# Patient Record
Sex: Female | Born: 2005 | Race: White | Hispanic: No | Marital: Single | Smoking: Never smoker
Health system: Southern US, Community
[De-identification: ages and names within clinical notes are randomized; demographics above are authoritative.]

## PROBLEM LIST (undated history)

## (undated) DIAGNOSIS — K8020 Calculus of gallbladder without cholecystitis without obstruction: Secondary | ICD-10-CM

## (undated) HISTORY — DX: Calculus of gallbladder without cholecystitis without obstruction: K80.20

---
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
## 2023-01-06 ENCOUNTER — Encounter: Payer: Self-pay | Admitting: Surgery

## 2023-01-06 ENCOUNTER — Telehealth: Payer: Self-pay | Admitting: Surgery

## 2023-01-06 ENCOUNTER — Ambulatory Visit (INDEPENDENT_AMBULATORY_CARE_PROVIDER_SITE_OTHER): Payer: Federal, State, Local not specified - PPO | Admitting: Surgery

## 2023-01-06 VITALS — BP 123/84 | HR 85 | Temp 98.8°F | Ht 64.0 in | Wt 202.2 lb

## 2023-01-06 DIAGNOSIS — K8020 Calculus of gallbladder without cholecystitis without obstruction: Secondary | ICD-10-CM

## 2023-01-06 NOTE — Telephone Encounter (Signed)
Spoke with mom, Victorino Dike, they have been advised of Pre-Admission date/time, and Surgery date at [facility].  Surgery Date: 01/16/23 Preadmission Testing Date: 01/10/23 (phone 1p-4p)  Patient has been made aware to call [phone], between 1-3:00pm the day before surgery, to find out what time to arrive for surgery.  '

## 2023-01-06 NOTE — Progress Notes (Signed)
01/06/2023  Reason for Visit: Symptomatic cholelithiasis  History of Present Illness: Kathleen Braun is a 17 y.o. female presenting for evaluation of symptomatic cholelithiasis.  The patient presented to emergency room on 12/28/2022 with right upper quadrant pain has been intermittent for several days, associated with nausea.  In the emergency room, her workup showed normal white blood cell count of 9.6 with normal LFTs.  She did have an ultrasound which showed multiple gallstones measuring up to 1.8 cm with borderline wall thickening of 3.2 mm but no pericholecystic fluid.  She was able to be discharged to home.  She reports that initially this pain was very sporadic about 2 years ago when it started but now is almost daily basis.  She is able to keep food down but reports pain with multiple meals.  There is a strong family history and her sister had cholecystectomy last year.  Past Medical History: Past Medical History:  Diagnosis Date   Cholelithiasis      Past Surgical History: History reviewed. No pertinent surgical history.  Home Medications: Prior to Admission medications   Medication Sig Start Date End Date Taking? Authorizing Provider  VIENVA 0.1-20 MG-MCG tablet Take 1 tablet by mouth daily.   Yes [provider]    Allergies: Allergies  Allergen Reactions   Sulfa Antibiotics Hives    Social History:  reports that she has never smoked. She has never used smokeless tobacco. She reports that she does not drink alcohol. No history on file for drug use.   Family History: There is family history of cholelithiasis and her sister had cholecystectomy last year.  Review of Systems: Review of Systems  Constitutional:  Negative for chills and fever.  HENT:  Negative for hearing loss.   Respiratory:  Negative for shortness of breath.   Cardiovascular:  Negative for chest pain.  Gastrointestinal:  Positive for abdominal pain and nausea. Negative for vomiting.   Genitourinary:  Negative for dysuria.  Musculoskeletal:  Negative for myalgias.  Skin:  Negative for rash.  Neurological:  Negative for dizziness.  Psychiatric/Behavioral:  Negative for depression.     Physical Exam BP 123/84   Pulse 85   Temp 98.8 F (37.1 C) (Oral)   Ht 5\' 4"  (1.626 m)   Wt 202 lb 3.2 oz (91.7 kg)   LMP 11/27/2022 (Approximate)   SpO2 98%   BMI 34.71 kg/m  CONSTITUTIONAL: No acute distress HEENT:  Normocephalic, atraumatic, extraocular motion intact. NECK: Trachea is midline, and there is no jugular venous distension.  RESPIRATORY:  Lungs are clear, and breath sounds are equal bilaterally. Normal respiratory effort without pathologic use of accessory muscles. CARDIOVASCULAR: Heart is regular without murmurs, gallops, or rubs. GI: The abdomen is soft, nondistended, with some tenderness to palpation in the right upper quadrant.  Negative Murphy's sign. MUSCULOSKELETAL:  Normal muscle strength and tone in all four extremities.  No peripheral edema or cyanosis. SKIN: Skin turgor is normal. There are no pathologic skin lesions.  NEUROLOGIC:  Motor and sensation is grossly normal.  Cranial nerves are grossly intact. PSYCH:  Alert and oriented to person, place and time. Affect is normal.  Laboratory Analysis: Labs from 12/28/2022: Sodium 134, potassium 3.6, chloride 105, CO2 22, BUN 13, creatinine 0.59.  Total bilirubin 0.5, AST 16, ALT 13, alkaline phosphatase 43, albumin 3.8, lipase 36.  WBC 9.6, hemoglobin 13.4, hematocrit 39.4, platelets 305.  Imaging: Ultrasound RUQ on 12/28/2022: IMPRESSION: Cholelithiasis.  No ultrasound evidence for acute cholecystitis.  Assessment and Plan: This  is a 17 y.o. female with symptomatic cholelithiasis.  - Discussed with patient the findings on her laboratory studies and imaging showing cholelithiasis with no evidence of acute cholecystitis.  Discussed with the patient that sometimes this can run in families and she does have a  family history of this.  Discussed with her how the gallstones in her gallbladder causing her episodes of biliary colic which can result in pain, nausea, and vomiting.  Discussed with her potential options for conservative management.  However given that she is having almost daily episodes of pain, I do not think that a low-fat diet may be enough to help her with this.  She is in agreement and would rather proceed with surgery at this point. - Discussed with her then the plan for robotic assisted cholecystectomy and reviewed the surgery at length with her including the planned incisions, risks of bleeding, infection, injury to surrounding structures, the use of ICG to better evaluate the biliary anatomy, postoperative activity restrictions, pain control, that this would be an outpatient surgery, and she is willing to proceed. - We will schedule her for surgery on 01/16/2023.  All of her questions have been answered.  I spent 60 minutes dedicated to the care of this patient on the date of this encounter to include pre-visit review of records, face-to-face time with the patient discussing diagnosis and management, and any post-visit coordination of care.   Howie Ill, MD [facility]

## 2023-01-06 NOTE — Patient Instructions (Addendum)
Our surgery scheduler Barbara will call you within 24-48 hours to get you scheduled. If you have not heard from her after 48 hours, please call our office. Have the blue sheet available when she calls to write down important information.   If you have any concerns or questions, please feel free to call our office.   Minimally Invasive Cholecystectomy Minimally invasive cholecystectomy is surgery to remove the gallbladder. The gallbladder is a pear-shaped organ that lies beneath the liver on the right side of the body. The gallbladder stores bile, which is a fluid that helps the body digest fats. Cholecystectomy is often done to treat inflammation (irritation and swelling) of the gallbladder (cholecystitis). This condition is usually caused by a buildup of gallstones (cholelithiasis) in the gallbladder or when the fluid in the gall bladder becomes stagnant because gallstones get stuck in the ducts (tubes) and block the flow of bile. This can result in inflammation and pain. In severe cases, emergency surgery may be required. This procedure is done through small incisions in the abdomen, instead of one large incision. It is also called laparoscopic surgery. A thin scope with a camera (laparoscope) is inserted through one incision. Then surgical instruments are inserted through the other incisions. In some cases, a minimally invasive surgery may need to be changed to a surgery that is done through a larger incision. This is called open surgery. Tell a health care provider about: Any allergies you have. All medicines you are taking, including vitamins, herbs, eye drops, creams, and over-the-counter medicines. Any problems you or family members have had with anesthetic medicines. Any bleeding problems you have. Any surgeries you have had. Any medical conditions you have. Whether you are pregnant or may be pregnant. What are the risks? Generally, this is a safe procedure. However, problems may occur,  including: Infection. Bleeding. Allergic reactions to medicines. Damage to nearby structures or organs. A gallstone remaining in the common bile duct. The common bile duct carries bile from the gallbladder to the small intestine. A bile leak from the liver or cystic duct after your gallbladder is removed. What happens before the procedure? When to stop eating and drinking Follow instructions from your health care provider about what you may eat and drink before your procedure. These may include: 8 hours before the procedure Stop eating most foods. Do not eat meat, fried foods, or fatty foods. Eat only light foods, such as toast or crackers. All liquids are okay except energy drinks and alcohol. 6 hours before the procedure Stop eating. Drink only clear liquids, such as water, clear fruit juice, black coffee, plain tea, and sports drinks. Do not drink energy drinks or alcohol. 2 hours before the procedure Stop drinking all liquids. You may be allowed to take medicines with small sips of water. If you do not follow your health care provider's instructions, your procedure may be delayed or canceled. Medicines Ask your health care provider about: Changing or stopping your regular medicines. This is especially important if you are taking diabetes medicines or blood thinners. Taking medicines such as aspirin and ibuprofen. These medicines can thin your blood. Do not take these medicines unless your health care provider tells you to take them. Taking over-the-counter medicines, vitamins, herbs, and supplements. General instructions If you will be going home right after the procedure, plan to have a responsible adult: Take you home from the hospital or clinic. You will not be allowed to drive. Care for you for the time you are told. Do   not use any products that contain nicotine or tobacco for at least 4 weeks before the procedure. These products include cigarettes, chewing tobacco, and vaping  devices, such as e-cigarettes. If you need help quitting, ask your health care provider. Ask your health care provider: How your surgery site will be marked. What steps will be taken to help prevent infection. These may include: Removing hair at the surgery site. Washing skin with a germ-killing soap. Taking antibiotic medicine. What happens during the procedure?  An IV will be inserted into one of your veins. You will be given one or both of the following: A medicine to help you relax (sedative). A medicine to make you fall asleep (general anesthetic). Your surgeon will make several small incisions in your abdomen. The laparoscope will be inserted through one of the small incisions. The camera on the laparoscope will send images to a monitor in the operating room. This lets your surgeon see inside your abdomen. A gas will be pumped into your abdomen. This will expand your abdomen to give the surgeon more room to perform the surgery. Other tools that are needed for the procedure will be inserted through the other incisions. The gallbladder will be removed through one of the incisions. Your common bile duct may be examined. If stones are found in the common bile duct, they may be removed. After your gallbladder has been removed, the incisions will be closed with stitches (sutures), staples, or skin glue. Your incisions will be covered with a bandage (dressing). The procedure may vary among health care providers and hospitals. What happens after the procedure? Your blood pressure, heart rate, breathing rate, and blood oxygen level will be monitored until you leave the hospital or clinic. You will be given medicines as needed to control your pain. You may have a drain placed in the incision. The drain will be removed a day or two after the procedure. Summary Minimally invasive cholecystectomy, also called laparoscopic cholecystectomy, is surgery to remove the gallbladder using small  incisions. Tell your health care provider about all the medical conditions you have and all the medicines you are taking for those conditions. Before the procedure, follow instructions about when to stop eating and drinking and changing or stopping medicines. Plan to have a responsible adult care for you for the time you are told after you leave the hospital or clinic. This information is not intended to replace advice given to you by your health care provider. Make sure you discuss any questions you have with your health care provider.  Gallbladder Eating Plan High blood cholesterol, obesity, a sedentary lifestyle, an unhealthy diet, and diabetes are risk factors for developing gallstones. If you have a gallbladder condition, you may have trouble digesting fats and tolerating high fat intake. Eating a low-fat diet can help reduce your symptoms and may be helpful before and after having surgery to remove your gallbladder (cholecystectomy). Your health care provider may recommend that you work with a dietitian to help you reduce the amount of fat in your diet. What are tips for following this plan? General guidelines Limit your fat intake to less than 30% of your total daily calories. If you eat around 1,800 calories each day, this means eating less than 60 grams (g) of fat per day. Fat is an important part of a healthy diet. Eating a low-fat diet can make it hard to maintain a healthy body weight. Ask your dietitian how much fat, calories, and other nutrients you need each day.   Eat small, frequent meals throughout the day instead of three large meals. Drink at least 8-10 cups (1.9-2.4 L) of fluid a day. Drink enough fluid to keep your urine pale yellow. If you drink alcohol: Limit how much you have to: 0-1 drink a day for women who are not pregnant. 0-2 drinks a day for men. Know how much alcohol is in a drink. In the [place] one drink equals one 12 oz bottle of beer (355 mL), one 5 oz glass of wine  (148 mL), or one 1 oz glass of hard liquor (44 mL). Reading food labels  Check nutrition facts on food labels for the amount of fat per serving. Choose foods with less than 3 grams of fat per serving. Shopping Choose nonfat and low-fat healthy foods. Look for the words "nonfat," "low-fat," or "fat-free." Avoid buying processed or prepackaged foods. Cooking Cook using low-fat methods, such as baking, broiling, grilling, or boiling. Cook with small amounts of healthy fats, such as olive oil, grapeseed oil, canola oil, avocado oil, or sunflower oil. What foods are recommended?  All fresh, frozen, or canned fruits and vegetables. Whole grains. Low-fat or nonfat (skim) milk and yogurt. Lean meat, skinless poultry, fish, eggs, and beans. Low-fat protein supplement powders or drinks. Spices and herbs. The items listed above may not be a complete list of foods and beverages you can eat and drink. Contact a dietitian for more information. What foods are not recommended? High-fat foods. These include baked goods, fast food, fatty cuts of meat, ice cream, french toast, sweet rolls, pizza, cheese bread, foods covered with butter, creamy sauces, or cheese. Fried foods. These include french fries, tempura, battered fish, breaded chicken, fried breads, and sweets. Foods that cause bloating and gas. The items listed above may not be a complete list of foods that you should avoid. Contact a dietitian for more information. Summary A low-fat diet can be helpful if you have a gallbladder condition, or before and after gallbladder surgery. Limit your fat intake to less than 30% of your total daily calories. This is about 60 g of fat if you eat 1,800 calories each day. Eat small, frequent meals throughout the day instead of three large meals. This information is not intended to replace advice given to you by your health care provider. Make sure you discuss any questions you have with your health care  provider. Document Revised: 06/08/2021 Document Reviewed: 06/08/2021 Elsevier Patient Education  2024 [employer]  

## 2023-01-06 NOTE — H&P (View-Only) (Signed)
01/06/2023  Reason for Visit: Symptomatic cholelithiasis  History of Present Illness: Kathleen Braun is a 17 y.o. female presenting for evaluation of symptomatic cholelithiasis.  The patient presented to emergency room on 12/28/2022 with right upper quadrant pain has been intermittent for several days, associated with nausea.  In the emergency room, her workup showed normal white blood cell count of 9.6 with normal LFTs.  She did have an ultrasound which showed multiple gallstones measuring up to 1.8 cm with borderline wall thickening of 3.2 mm but no pericholecystic fluid.  She was able to be discharged to home.  She reports that initially this pain was very sporadic about 2 years ago when it started but now is almost daily basis.  She is able to keep food down but reports pain with multiple meals.  There is a strong family history and her sister had cholecystectomy last year.  Past Medical History: Past Medical History:  Diagnosis Date   Cholelithiasis      Past Surgical History: History reviewed. No pertinent surgical history.  Home Medications: Prior to Admission medications   Medication Sig Start Date End Date Taking? Authorizing Provider  VIENVA 0.1-20 MG-MCG tablet Take 1 tablet by mouth daily.   Yes [provider]    Allergies: Allergies  Allergen Reactions   Sulfa Antibiotics Hives    Social History:  reports that she has never smoked. She has never used smokeless tobacco. She reports that she does not drink alcohol. No history on file for drug use.   Family History: There is family history of cholelithiasis and her sister had cholecystectomy last year.  Review of Systems: Review of Systems  Constitutional:  Negative for chills and fever.  HENT:  Negative for hearing loss.   Respiratory:  Negative for shortness of breath.   Cardiovascular:  Negative for chest pain.  Gastrointestinal:  Positive for abdominal pain and nausea. Negative for vomiting.   Genitourinary:  Negative for dysuria.  Musculoskeletal:  Negative for myalgias.  Skin:  Negative for rash.  Neurological:  Negative for dizziness.  Psychiatric/Behavioral:  Negative for depression.     Physical Exam BP 123/84   Pulse 85   Temp 98.8 F (37.1 C) (Oral)   Ht 5' 4" (1.626 m)   Wt 202 lb 3.2 oz (91.7 kg)   LMP 11/27/2022 (Approximate)   SpO2 98%   BMI 34.71 kg/m  CONSTITUTIONAL: No acute distress HEENT:  Normocephalic, atraumatic, extraocular motion intact. NECK: Trachea is midline, and there is no jugular venous distension.  RESPIRATORY:  Lungs are clear, and breath sounds are equal bilaterally. Normal respiratory effort without pathologic use of accessory muscles. CARDIOVASCULAR: Heart is regular without murmurs, gallops, or rubs. GI: The abdomen is soft, nondistended, with some tenderness to palpation in the right upper quadrant.  Negative Murphy's sign. MUSCULOSKELETAL:  Normal muscle strength and tone in all four extremities.  No peripheral edema or cyanosis. SKIN: Skin turgor is normal. There are no pathologic skin lesions.  NEUROLOGIC:  Motor and sensation is grossly normal.  Cranial nerves are grossly intact. PSYCH:  Alert and oriented to person, place and time. Affect is normal.  Laboratory Analysis: Labs from 12/28/2022: Sodium 134, potassium 3.6, chloride 105, CO2 22, BUN 13, creatinine 0.59.  Total bilirubin 0.5, AST 16, ALT 13, alkaline phosphatase 43, albumin 3.8, lipase 36.  WBC 9.6, hemoglobin 13.4, hematocrit 39.4, platelets 305.  Imaging: Ultrasound RUQ on 12/28/2022: IMPRESSION: Cholelithiasis.  No ultrasound evidence for acute cholecystitis.  Assessment and Plan: This   is a 17 y.o. female with symptomatic cholelithiasis.  - Discussed with patient the findings on her laboratory studies and imaging showing cholelithiasis with no evidence of acute cholecystitis.  Discussed with the patient that sometimes this can run in families and she does have a  family history of this.  Discussed with her how the gallstones in her gallbladder causing her episodes of biliary colic which can result in pain, nausea, and vomiting.  Discussed with her potential options for conservative management.  However given that she is having almost daily episodes of pain, I do not think that a low-fat diet may be enough to help her with this.  She is in agreement and would rather proceed with surgery at this point. - Discussed with her then the plan for robotic assisted cholecystectomy and reviewed the surgery at length with her including the planned incisions, risks of bleeding, infection, injury to surrounding structures, the use of ICG to better evaluate the biliary anatomy, postoperative activity restrictions, pain control, that this would be an outpatient surgery, and she is willing to proceed. - We will schedule her for surgery on 01/16/2023.  All of her questions have been answered.  I spent 60 minutes dedicated to the care of this patient on the date of this encounter to include pre-visit review of records, face-to-face time with the patient discussing diagnosis and management, and any post-visit coordination of care.   Yeray Tomas Luis Marwin Primmer, MD [facility]    

## 2023-01-10 ENCOUNTER — Encounter
Admission: RE | Admit: 2023-01-10 | Discharge: 2023-01-10 | Disposition: A | Discharge status: Home / Self Care | Payer: Federal, State, Local not specified - PPO | Source: Ambulatory Visit | Attending: Surgery | Admitting: Surgery

## 2023-01-10 VITALS — Ht 64.0 in | Wt 202.0 lb

## 2023-01-10 DIAGNOSIS — Z01818 Encounter for other preprocedural examination: Secondary | ICD-10-CM

## 2023-01-10 NOTE — Patient Instructions (Signed)
Your procedure is scheduled on: Thursday July 11 Report to the Registration Desk on the 1st floor of the [facility] To find out your arrival time, please call [phone] between 1PM - 3PM on: Wednesday 10  If your arrival time is 6:00 am, do not arrive before that time as the [facility] entrance doors do not open until 6:00 am.  REMEMBER: Instructions that are not followed completely may result in serious medical risk, up to and including death; or upon the discretion of your surgeon and anesthesiologist your surgery may need to be rescheduled.  Do not eat food after midnight the night before surgery.  No gum chewing or hard candies.  You may however, drink CLEAR liquids up to 2 hours before you are scheduled to arrive for your surgery. Do not drink anything within 2 hours of your scheduled arrival time.  Clear liquids include: - water  - apple juice without pulp - gatorade (not RED colors) - black coffee or tea (Do NOT add milk or creamers to the coffee or tea) Do NOT drink anything that is not on this list.  One week prior to surgery: Stop Anti-inflammatories (NSAIDS) such as Advil, Aleve, Ibuprofen, Motrin, Naproxen, Naprosyn and Aspirin based products such as Excedrin, Goody's Powder, BC Powder.  Stop ANY OVER THE COUNTER supplements until after surgery.  You may however, continue to take Tylenol if needed for pain up until the day of surgery.   TAKE ONLY THESE MEDICATIONS THE MORNING OF SURGERY WITH A SIP OF WATER:    No Alcohol for 24 hours before or after surgery.  No Smoking including e-cigarettes for 24 hours before surgery.  No chewable tobacco products for at least 6 hours before surgery.  No nicotine patches on the day of surgery.  Do not use any "recreational" drugs for at least a week (preferably 2 weeks) before your surgery.  Please be advised that the combination of cocaine and anesthesia may have negative outcomes, up to and including death. If you  test positive for cocaine, your surgery will be cancelled.  On the morning of surgery brush your teeth with toothpaste and water, you may rinse your mouth with mouthwash if you wish. Do not swallow any toothpaste or mouthwash.  Use CHG Soap as directed on instruction sheet.  Do not wear jewelry, make-up, hairpins, clips or nail polish.  Do not wear lotions, powders, or perfumes.   Do not shave body hair from the neck down 48 hours before surgery.  Contact lenses, hearing aids and dentures may not be worn into surgery.  Do not bring valuables to the hospital. [facility] is not responsible for any missing/lost belongings or valuables.   Notify your doctor if there is any change in your medical condition (cold, fever, infection).  Wear comfortable clothing (specific to your surgery type) to the hospital.  After surgery, you can help prevent lung complications by doing breathing exercises.  Take deep breaths and cough every 1-2 hours. Your doctor may order a device called an Incentive Spirometer to help you take deep breaths. When coughing or sneezing, hold a pillow firmly against your incision with both hands. This is called "splinting." Doing this helps protect your incision. It also decreases belly discomfort.  If you are being discharged the day of surgery, you will not be allowed to drive home. You will need a responsible individual to drive you home and stay with you for 24 hours after surgery.   If you are taking  public transportation, you will need to have a responsible individual with you.  Please call the Pre-admissions Testing Dept. at [phone] if you have any questions about these instructions.  Surgery Visitation Policy:  Patients having surgery or a procedure may have two visitors.  Children under the age of 66 must have an adult with them who is not the patient.      Preparing for Surgery with CHLORHEXIDINE GLUCONATE (CHG) Soap  Chlorhexidine Gluconate  (CHG) Soap  o An antiseptic cleaner that kills germs and bonds with the skin to continue killing germs even after washing  o Used for showering the night before surgery and morning of surgery  Before surgery, you can play an important role by reducing the number of germs on your skin.  CHG (Chlorhexidine gluconate) soap is an antiseptic cleanser which kills germs and bonds with the skin to continue killing germs even after washing.  Please do not use if you have an allergy to CHG or antibacterial soaps. If your skin becomes reddened/irritated stop using the CHG.  1. Shower the NIGHT BEFORE SURGERY and the MORNING OF SURGERY with CHG soap.  2. If you choose to wash your hair, wash your hair first as usual with your normal shampoo.  3. After shampooing, rinse your hair and body thoroughly to remove the shampoo.  4. Use CHG as you would any other liquid soap. You can apply CHG directly to the skin and wash gently with a scrungie or a clean washcloth.  5. Apply the CHG soap to your body only from the neck down. Do not use on open wounds or open sores. Avoid contact with your eyes, ears, mouth, and genitals (private parts). Wash face and genitals (private parts) with your normal soap.  6. Wash thoroughly, paying special attention to the area where your surgery will be performed.  7. Thoroughly rinse your body with warm water.  8. Do not shower/wash with your normal soap after using and rinsing off the CHG soap.  9. Pat yourself dry with a clean towel.  10. Wear clean pajamas to bed the night before surgery.  12. Place clean sheets on your bed the night of your first shower and do not sleep with pets.  13. Shower again with the CHG soap on the day of surgery prior to arriving at the hospital.  14. Do not apply any deodorants/lotions/powders.  15. Please wear clean clothes to the hospital.

## 2023-01-16 ENCOUNTER — Encounter: Payer: Self-pay | Admitting: Surgery

## 2023-01-16 ENCOUNTER — Ambulatory Visit
Admission: RE | Admit: 2023-01-16 | Discharge: 2023-01-16 | Disposition: A | Discharge status: Home / Self Care | Payer: Federal, State, Local not specified - PPO | Attending: Surgery | Admitting: Surgery

## 2023-01-16 ENCOUNTER — Other Ambulatory Visit: Payer: Self-pay

## 2023-01-16 ENCOUNTER — Ambulatory Visit: Payer: Federal, State, Local not specified - PPO | Admitting: Anesthesiology

## 2023-01-16 ENCOUNTER — Encounter
Admission: RE | Disposition: A | Discharge status: Home / Self Care | Payer: Self-pay | Source: Home / Self Care | Attending: Surgery

## 2023-01-16 DIAGNOSIS — Z01818 Encounter for other preprocedural examination: Secondary | ICD-10-CM

## 2023-01-16 DIAGNOSIS — K8010 Calculus of gallbladder with chronic cholecystitis without obstruction: Secondary | ICD-10-CM | POA: Diagnosis present

## 2023-01-16 DIAGNOSIS — K8020 Calculus of gallbladder without cholecystitis without obstruction: Secondary | ICD-10-CM

## 2023-01-16 LAB — POCT PREGNANCY, URINE: Preg Test, Ur: NEGATIVE

## 2023-01-16 SURGERY — CHOLECYSTECTOMY, ROBOT-ASSISTED, LAPAROSCOPIC
Anesthesia: General

## 2023-01-16 MED ORDER — KETOROLAC TROMETHAMINE 30 MG/ML IJ SOLN
INTRAMUSCULAR | Status: AC
  Filled 2023-01-16: qty 1

## 2023-01-16 MED ORDER — PHENYLEPHRINE 80 MCG/ML (10ML) SYRINGE FOR IV PUSH (FOR BLOOD PRESSURE SUPPORT)
PREFILLED_SYRINGE | INTRAVENOUS | Status: AC
  Filled 2023-01-16: qty 10

## 2023-01-16 MED ORDER — PROPOFOL 10 MG/ML IV BOLUS
INTRAVENOUS | Status: AC
  Filled 2023-01-16: qty 20

## 2023-01-16 MED ORDER — LACTATED RINGERS IV SOLN: INTRAVENOUS | Status: DC

## 2023-01-16 MED ORDER — BUPIVACAINE LIPOSOME 1.3 % IJ SUSP: 20.0000 mL | Freq: Once | INTRAMUSCULAR | Status: DC

## 2023-01-16 MED ORDER — MIDAZOLAM HCL 2 MG/2ML IJ SOLN
INTRAMUSCULAR | Status: AC
  Filled 2023-01-16: qty 2

## 2023-01-16 MED ORDER — DEXAMETHASONE SODIUM PHOSPHATE 10 MG/ML IJ SOLN
INTRAMUSCULAR | Status: AC
  Filled 2023-01-16: qty 1

## 2023-01-16 MED ORDER — KETOROLAC TROMETHAMINE 30 MG/ML IJ SOLN
INTRAMUSCULAR | Status: DC | PRN
  Administered 2023-01-16: 30 mg via INTRAVENOUS

## 2023-01-16 MED ORDER — SUGAMMADEX SODIUM 200 MG/2ML IV SOLN
INTRAVENOUS | Status: DC | PRN
  Administered 2023-01-16: 200 mg via INTRAVENOUS

## 2023-01-16 MED ORDER — ONDANSETRON HCL 4 MG PO TABS: 4.0000 mg | ORAL_TABLET | Freq: Three times a day (TID) | ORAL | 0 refills | Status: DC | PRN

## 2023-01-16 MED ORDER — LIDOCAINE HCL (PF) 2 % IJ SOLN
INTRAMUSCULAR | Status: AC
  Filled 2023-01-16: qty 5

## 2023-01-16 MED ORDER — CEFAZOLIN SODIUM-DEXTROSE 2-4 GM/100ML-% IV SOLN
INTRAVENOUS | Status: AC
  Filled 2023-01-16: qty 100

## 2023-01-16 MED ORDER — FENTANYL CITRATE (PF) 100 MCG/2ML IJ SOLN: 25.0000 ug | INTRAMUSCULAR | Status: DC | PRN

## 2023-01-16 MED ORDER — DEXAMETHASONE SODIUM PHOSPHATE 10 MG/ML IJ SOLN
INTRAMUSCULAR | Status: DC | PRN
  Administered 2023-01-16: 10 mg via INTRAVENOUS

## 2023-01-16 MED ORDER — BUPIVACAINE LIPOSOME 1.3 % IJ SUSP
INTRAMUSCULAR | Status: DC | PRN
  Administered 2023-01-16: 20 mL

## 2023-01-16 MED ORDER — PROMETHAZINE HCL 25 MG/ML IJ SOLN
INTRAMUSCULAR | Status: AC
  Filled 2023-01-16: qty 1

## 2023-01-16 MED ORDER — ACETAMINOPHEN 10 MG/ML IV SOLN: 1000.0000 mg | Freq: Once | INTRAVENOUS | Status: DC | PRN

## 2023-01-16 MED ORDER — EPHEDRINE 5 MG/ML INJ
INTRAVENOUS | Status: AC
  Filled 2023-01-16: qty 5

## 2023-01-16 MED ORDER — CHLORHEXIDINE GLUCONATE 0.12 % MT SOLN
15.0000 mL | Freq: Once | OROMUCOSAL | Status: AC
  Administered 2023-01-16: 15 mL via OROMUCOSAL

## 2023-01-16 MED ORDER — 0.9 % SODIUM CHLORIDE (POUR BTL) OPTIME
TOPICAL | Status: DC | PRN
  Administered 2023-01-16: 500 mL

## 2023-01-16 MED ORDER — GABAPENTIN 300 MG PO CAPS
300.0000 mg | ORAL_CAPSULE | ORAL | Status: AC
  Administered 2023-01-16: 300 mg via ORAL

## 2023-01-16 MED ORDER — MIDAZOLAM HCL 2 MG/2ML IJ SOLN
INTRAMUSCULAR | Status: DC | PRN
  Administered 2023-01-16: 2 mg via INTRAVENOUS

## 2023-01-16 MED ORDER — OXYCODONE HCL 5 MG PO TABS: 5.0000 mg | ORAL_TABLET | Freq: Once | ORAL | Status: DC | PRN

## 2023-01-16 MED ORDER — DROPERIDOL 2.5 MG/ML IJ SOLN
0.6250 mg | Freq: Once | INTRAMUSCULAR | Status: AC | PRN
  Administered 2023-01-16: 0.625 mg via INTRAVENOUS

## 2023-01-16 MED ORDER — FAMOTIDINE 20 MG PO TABS
20.0000 mg | ORAL_TABLET | Freq: Once | ORAL | Status: AC
  Administered 2023-01-16: 20 mg via ORAL

## 2023-01-16 MED ORDER — OXYCODONE HCL 5 MG/5ML PO SOLN: 5.0000 mg | Freq: Once | ORAL | Status: DC | PRN

## 2023-01-16 MED ORDER — PROMETHAZINE HCL 25 MG/ML IJ SOLN
6.2500 mg | INTRAMUSCULAR | Status: DC | PRN
  Administered 2023-01-16: 6.25 mg via INTRAVENOUS

## 2023-01-16 MED ORDER — ROCURONIUM BROMIDE 10 MG/ML (PF) SYRINGE
PREFILLED_SYRINGE | INTRAVENOUS | Status: AC
  Filled 2023-01-16: qty 10

## 2023-01-16 MED ORDER — CHLORHEXIDINE GLUCONATE 0.12 % MT SOLN
OROMUCOSAL | Status: AC
  Filled 2023-01-16: qty 15

## 2023-01-16 MED ORDER — FENTANYL CITRATE (PF) 100 MCG/2ML IJ SOLN
INTRAMUSCULAR | Status: DC | PRN
  Administered 2023-01-16 (×2): 50 ug via INTRAVENOUS

## 2023-01-16 MED ORDER — PROPOFOL 10 MG/ML IV BOLUS
INTRAVENOUS | Status: DC | PRN
  Administered 2023-01-16: 200 mg via INTRAVENOUS

## 2023-01-16 MED ORDER — CHLORHEXIDINE GLUCONATE CLOTH 2 % EX PADS
6.0000 | MEDICATED_PAD | Freq: Once | CUTANEOUS | Status: AC
  Administered 2023-01-16: 6 via TOPICAL

## 2023-01-16 MED ORDER — GABAPENTIN 300 MG PO CAPS
ORAL_CAPSULE | ORAL | Status: AC
  Filled 2023-01-16: qty 1

## 2023-01-16 MED ORDER — ACETAMINOPHEN 10 MG/ML IV SOLN
INTRAVENOUS | Status: AC
  Filled 2023-01-16: qty 100

## 2023-01-16 MED ORDER — BUPIVACAINE LIPOSOME 1.3 % IJ SUSP
INTRAMUSCULAR | Status: AC
  Filled 2023-01-16: qty 20

## 2023-01-16 MED ORDER — ACETAMINOPHEN 500 MG PO TABS
ORAL_TABLET | ORAL | Status: AC
  Filled 2023-01-16: qty 2

## 2023-01-16 MED ORDER — BUPIVACAINE-EPINEPHRINE (PF) 0.25% -1:200000 IJ SOLN
INTRAMUSCULAR | Status: DC | PRN
  Administered 2023-01-16: 30 mL

## 2023-01-16 MED ORDER — PHENYLEPHRINE HCL (PRESSORS) 10 MG/ML IV SOLN
INTRAVENOUS | Status: DC | PRN
  Administered 2023-01-16 (×4): 80 ug via INTRAVENOUS

## 2023-01-16 MED ORDER — INDOCYANINE GREEN 25 MG IV SOLR
2.5000 mg | INTRAVENOUS | Status: AC
  Administered 2023-01-16: 2.5 mg via INTRAVENOUS

## 2023-01-16 MED ORDER — ORAL CARE MOUTH RINSE: 15.0000 mL | Freq: Once | OROMUCOSAL | Status: AC

## 2023-01-16 MED ORDER — LIDOCAINE HCL 4 % EX SOLN
CUTANEOUS | Status: DC | PRN
  Administered 2023-01-16: 4 mL via TOPICAL

## 2023-01-16 MED ORDER — FAMOTIDINE 20 MG PO TABS
ORAL_TABLET | ORAL | Status: AC
  Filled 2023-01-16: qty 1

## 2023-01-16 MED ORDER — INDOCYANINE GREEN 25 MG IV SOLR
INTRAVENOUS | Status: AC
  Filled 2023-01-16: qty 10

## 2023-01-16 MED ORDER — DROPERIDOL 2.5 MG/ML IJ SOLN
INTRAMUSCULAR | Status: AC
  Filled 2023-01-16: qty 2

## 2023-01-16 MED ORDER — FENTANYL CITRATE (PF) 100 MCG/2ML IJ SOLN
INTRAMUSCULAR | Status: AC
  Filled 2023-01-16: qty 2

## 2023-01-16 MED ORDER — ACETAMINOPHEN 500 MG PO TABS: 1000.0000 mg | ORAL_TABLET | ORAL | Status: DC

## 2023-01-16 MED ORDER — HYDROCODONE-ACETAMINOPHEN 5-325 MG PO TABS: 1.0000 | ORAL_TABLET | ORAL | 0 refills | Status: DC | PRN

## 2023-01-16 MED ORDER — ONDANSETRON HCL 4 MG/2ML IJ SOLN
INTRAMUSCULAR | Status: DC | PRN
  Administered 2023-01-16: 4 mg via INTRAVENOUS

## 2023-01-16 MED ORDER — ONDANSETRON HCL 4 MG/2ML IJ SOLN
INTRAMUSCULAR | Status: AC
  Filled 2023-01-16: qty 2

## 2023-01-16 MED ORDER — ROCURONIUM BROMIDE 100 MG/10ML IV SOLN
INTRAVENOUS | Status: DC | PRN
  Administered 2023-01-16: 50 mg via INTRAVENOUS

## 2023-01-16 MED ORDER — CEFAZOLIN SODIUM-DEXTROSE 2-4 GM/100ML-% IV SOLN
2.0000 g | INTRAVENOUS | Status: AC
  Administered 2023-01-16: 2 g via INTRAVENOUS

## 2023-01-16 MED ORDER — EPHEDRINE SULFATE (PRESSORS) 50 MG/ML IJ SOLN
INTRAMUSCULAR | Status: DC | PRN
  Administered 2023-01-16: 10 mg via INTRAVENOUS

## 2023-01-16 MED ORDER — ACETAMINOPHEN 10 MG/ML IV SOLN
INTRAVENOUS | Status: DC | PRN
  Administered 2023-01-16: 1000 mg via INTRAVENOUS

## 2023-01-16 MED ORDER — LIDOCAINE HCL (CARDIAC) PF 100 MG/5ML IV SOSY
PREFILLED_SYRINGE | INTRAVENOUS | Status: DC | PRN
  Administered 2023-01-16: 100 mg via INTRAVENOUS

## 2023-01-16 SURGICAL SUPPLY — 49 items
ADH SKN CLS APL DERMABOND .7 (GAUZE/BANDAGES/DRESSINGS) ×1
BAG PRESSURE INF REUSE 1000 (BAG) IMPLANT
CANNULA CAP OBTURATR AIRSEAL 8 (CAP) IMPLANT
CAUTERY HOOK MNPLR 1.6 DVNC XI (INSTRUMENTS) ×1 IMPLANT
CLIP LIGATING HEMO O LOK GREEN (MISCELLANEOUS) ×1 IMPLANT
DERMABOND ADVANCED .7 DNX12 (GAUZE/BANDAGES/DRESSINGS) ×1 IMPLANT
DRAPE ARM DVNC X/XI (DISPOSABLE) ×4 IMPLANT
DRAPE COLUMN DVNC XI (DISPOSABLE) ×1 IMPLANT
ELECT CAUTERY BLADE TIP 2.5 (TIP) ×1
ELECT REM PT RETURN 9FT ADLT (ELECTROSURGICAL) ×1
ELECTRODE CAUTERY BLDE TIP 2.5 (TIP) ×1 IMPLANT
ELECTRODE REM PT RTRN 9FT ADLT (ELECTROSURGICAL) ×1 IMPLANT
FORCEPS BPLR R/ABLATION 8 DVNC (INSTRUMENTS) ×1 IMPLANT
FORCEPS PROGRASP DVNC XI (FORCEP) ×1 IMPLANT
GLOVE SURG SYN 7.0 (GLOVE) ×2 IMPLANT
GLOVE SURG SYN 7.0 PF PI (GLOVE) ×2 IMPLANT
GLOVE SURG SYN 7.5 E (GLOVE) ×2 IMPLANT
GLOVE SURG SYN 7.5 PF PI (GLOVE) ×2 IMPLANT
GOWN STRL REUS W/ TWL LRG LVL3 (GOWN DISPOSABLE) ×4 IMPLANT
GOWN STRL REUS W/TWL LRG LVL3 (GOWN DISPOSABLE) ×4
IRRIGATOR SUCT 8 DISP DVNC XI (IRRIGATION / IRRIGATOR) IMPLANT
IV NS 1000ML (IV SOLUTION)
IV NS 1000ML BAXH (IV SOLUTION) IMPLANT
KIT PINK PAD W/HEAD ARE REST (MISCELLANEOUS) ×1
KIT PINK PAD W/HEAD ARM REST (MISCELLANEOUS) ×1 IMPLANT
LABEL OR SOLS (LABEL) ×1 IMPLANT
MANIFOLD NEPTUNE II (INSTRUMENTS) ×1 IMPLANT
NDL HYPO 22X1.5 SAFETY MO (MISCELLANEOUS) ×1 IMPLANT
NEEDLE HYPO 22X1.5 SAFETY MO (MISCELLANEOUS) ×1 IMPLANT
NS IRRIG 500ML POUR BTL (IV SOLUTION) ×1 IMPLANT
OBTURATOR OPTICAL STND 8 DVNC (TROCAR) ×1
OBTURATOR OPTICALSTD 8 DVNC (TROCAR) ×1 IMPLANT
PACK LAP CHOLECYSTECTOMY (MISCELLANEOUS) ×1 IMPLANT
PENCIL SMOKE EVACUATOR (MISCELLANEOUS) ×1 IMPLANT
SEAL UNIV 5-12 XI (MISCELLANEOUS) ×4 IMPLANT
SET TUBE FILTERED XL AIRSEAL (SET/KITS/TRAYS/PACK) IMPLANT
SET TUBE SMOKE EVAC HIGH FLOW (TUBING) ×1 IMPLANT
SOL ELECTROSURG ANTI STICK (MISCELLANEOUS) ×1
SOLUTION ELECTROSURG ANTI STCK (MISCELLANEOUS) ×1 IMPLANT
SPIKE FLUID TRANSFER (MISCELLANEOUS) ×1 IMPLANT
SPONGE T-LAP 18X18 ~~LOC~~+RFID (SPONGE) IMPLANT
SPONGE T-LAP 4X18 ~~LOC~~+RFID (SPONGE) ×1 IMPLANT
SUT MNCRL AB 4-0 PS2 18 (SUTURE) ×1 IMPLANT
SUT VIC AB 3-0 SH 27 (SUTURE)
SUT VIC AB 3-0 SH 27X BRD (SUTURE) IMPLANT
SUT VICRYL 0 UR6 27IN ABS (SUTURE) ×2 IMPLANT
SYS BAG RETRIEVAL 10MM (BASKET) ×1
SYSTEM BAG RETRIEVAL 10MM (BASKET) ×1 IMPLANT
WATER STERILE IRR 500ML POUR (IV SOLUTION) ×1 IMPLANT

## 2023-01-16 NOTE — Transfer of Care (Signed)
Immediate Anesthesia Transfer of Care Note  Patient: Serah Boydstun  Procedure(s) Performed: XI ROBOTIC ASSISTED LAPAROSCOPIC CHOLECYSTECTOMY INDOCYANINE GREEN FLUORESCENCE IMAGING (ICG)  Patient Location: PACU  Anesthesia Type:General  Level of Consciousness: drowsy  Airway & Oxygen Therapy: Patient Spontanous Breathing and Patient connected to face mask oxygen  Post-op Assessment: Report given to RN and Post -op Vital signs reviewed and stable  Post vital signs: Reviewed and stable  Last Vitals:  Vitals Value Taken Time  BP 136/63 01/16/23 1532  Temp 36.3 C 01/16/23 1532  Pulse 108 01/16/23 1536  Resp 24 01/16/23 1536  SpO2 100 % 01/16/23 1536  Vitals shown include unfiled device data.  Last Pain:  Vitals:   01/16/23 1532  TempSrc:   PainSc: Asleep         Complications: No notable events documented.

## 2023-01-16 NOTE — Anesthesia Preprocedure Evaluation (Addendum)
Anesthesia Evaluation  Patient identified by MRN, date of birth, ID band Patient awake    Reviewed: Allergy & Precautions, H&P , NPO status , Patient's Chart, lab work & pertinent test results  Airway Mallampati: II  TM Distance: >3 FB Neck ROM: full    Dental no notable dental hx.    Pulmonary neg pulmonary ROS   Pulmonary exam normal        Cardiovascular negative cardio ROS Normal cardiovascular exam     Neuro/Psych negative neurological ROS  negative psych ROS   GI/Hepatic Neg liver ROS,,,Cholelithiasis   Endo/Other  negative endocrine ROS    Renal/GU      Musculoskeletal   Abdominal  (+) + obese  Peds  Hematology negative hematology ROS (+)   Anesthesia Other Findings Past Medical History: No date: Cholelithiasis       Reproductive/Obstetrics negative OB ROS                             Anesthesia Physical Anesthesia Plan  ASA: 2  Anesthesia Plan: General ETT   Post-op Pain Management: Tylenol PO (pre-op)*, Gabapentin PO (pre-op)* and Toradol IV (intra-op)*   Induction: Intravenous  PONV Risk Score and Plan: 4 or greater and Ondansetron, Dexamethasone, Midazolam and Droperidol  Airway Management Planned: Oral ETT  Additional Equipment:   Intra-op Plan:   Post-operative Plan: Extubation in OR  Informed Consent: I have reviewed the patients History and Physical, chart, labs and discussed the procedure including the risks, benefits and alternatives for the proposed anesthesia with the patient or authorized representative who has indicated his/her understanding and acceptance.     Dental Advisory Given  Plan Discussed with: CRNA and Surgeon  Anesthesia Plan Comments:         Anesthesia Quick Evaluation

## 2023-01-16 NOTE — Op Note (Signed)
Robotic assisted laparoscopic Cholecystectomy  Pre-operative Diagnosis: chronic cholecystitis  Post-operative Diagnosis: same  Procedure:  Robotic assisted laparoscopic Cholecystectomy  Surgeon: Sterling Big, MD FACS  Anesthesia: Gen. with endotracheal tube  Findings: Chronic Cholecystitis w cholelithiasis   Estimated Blood Loss: 5cc       Specimens: Gallbladder           Complications: none   Procedure Details  The patient was seen again in the Holding Room. The benefits, complications, treatment options, and expected outcomes were discussed with the patient. The risks of bleeding, infection, recurrence of symptoms, failure to resolve symptoms, bile duct damage, bile duct leak, retained common bile duct stone, bowel injury, any of which could require further surgery and/or ERCP, stent, or papillotomy were reviewed with the patient. The likelihood of improving the patient's symptoms with return to their baseline status is good.  The patient and/or family concurred with the proposed plan, giving informed consent.  The patient was taken to Operating Room, identified  and the procedure verified as Laparoscopic Cholecystectomy.  A Time Out was held and the above information confirmed.  Prior to the induction of general anesthesia, antibiotic prophylaxis was administered. VTE prophylaxis was in place. General endotracheal anesthesia was then administered and tolerated well. After the induction, the abdomen was prepped with Chloraprep and draped in the sterile fashion. The patient was positioned in the supine position.  Cut down technique was used to enter the abdominal cavity and a Hasson trochar was placed after two vicryl stitches were anchored to the fascia. Pneumoperitoneum was then created with CO2 and tolerated well without any adverse changes in the patient's vital signs.  Three 8-mm ports were placed under direct vision. All skin incisions  were infiltrated with a local anesthetic  agent before making the incision and placing the trocars.   The patient was positioned  in reverse Trendelenburg, robot was brought to the surgical field and docked in the standard fashion.  We made sure all the instrumentation was kept indirect view at all times and that there were no collision between the arms. I scrubbed out and went to the console.  The gallbladder was identified, the fundus grasped and retracted cephalad. Adhesions were lysed bluntly. The infundibulum was grasped and retracted laterally, exposing the peritoneum overlying the triangle of Calot. This was then divided and exposed in a blunt fashion. An extended critical view of the cystic duct and cystic artery was obtained.  The cystic duct was clearly identified and bluntly dissected.   Artery and duct were double clipped and divided. Using ICG cholangiography we visualize the cystic duct and CBD w/o  evidence of bile injuries. The gallbladder was taken from the gallbladder fossa in a retrograde fashion with the electrocautery.  Hemostasis was achieved with the electrocautery. nspection of the right upper quadrant was performed. No bleeding, bile duct injury or leak, or bowel injury was noted. Robotic instruments and robotic arms were undocked in the standard fashion.  I scrubbed back in.  The gallbladder was removed and placed in an Endocatch bag.   Pneumoperitoneum was released.  The periumbilical port site was closed with interrumpted 0 Vicryl sutures. 4-0 subcuticular Monocryl was used to close the skin. Dermabond was  applied.  The patient was then extubated and brought to the recovery room in stable condition. Sponge, lap, and needle counts were correct at closure and at the conclusion of the case.               Kathleen Riebe Roebuck,  MD, FACS

## 2023-01-16 NOTE — Discharge Instructions (Signed)
AMBULATORY SURGERY  ?DISCHARGE INSTRUCTIONS ? ? ?The drugs that you were given will stay in your system until tomorrow so for the next 24 hours you should not: ? ?Drive an automobile ?Make any legal decisions ?Drink any alcoholic beverage ? ? ?You may resume regular meals tomorrow.  Today it is better to start with liquids and gradually work up to solid foods. ? ?You may eat anything you prefer, but it is better to start with liquids, then soup and crackers, and gradually work up to solid foods. ? ? ?Please notify your doctor immediately if you have any unusual bleeding, trouble breathing, redness and pain at the surgery site, drainage, fever, or pain not relieved by medication. ? ? ? ?Additional Instructions: ? ? ? ?Please contact your physician with any problems or Same Day Surgery at [phone], Monday through Friday 6 am to 4 pm, or [place] [facility] [place] Main number at [phone].  ?

## 2023-01-16 NOTE — Anesthesia Procedure Notes (Signed)
Procedure Name: Intubation Date/Time: 01/16/2023 2:26 PM  Performed by: Morene Crocker, CRNAPre-anesthesia Checklist: Patient identified, Patient being monitored, Timeout performed, Emergency Drugs available and Suction available Patient Re-evaluated:Patient Re-evaluated prior to induction Oxygen Delivery Method: Circle system utilized Preoxygenation: Pre-oxygenation with 100% oxygen Induction Type: IV induction Ventilation: Mask ventilation without difficulty Laryngoscope Size: 3 and McGraph Grade View: Grade I Tube type: Oral Tube size: 6.5 mm Number of attempts: 1 Airway Equipment and Method: Stylet Placement Confirmation: ETT inserted through vocal cords under direct vision, positive ETCO2 and breath sounds checked- equal and bilateral Secured at: 21 cm Tube secured with: Tape Dental Injury: Teeth and Oropharynx as per pre-operative assessment  Comments: Smooth, atraumatic intubation. No complications noted.

## 2023-01-16 NOTE — Interval H&P Note (Signed)
History and Physical Interval Note:  01/16/2023 1:36 PM I have seen and examined her and looked over her results. Sympt cholelithiasis, crescendo sxs. In need for chole. The risks, benefits, complications, treatment options, and expected outcomes were discussed with the patient. The possibilities of bleeding, recurrent infection, finding a normal gallbladder, perforation of viscus organs, damage to surrounding structures, bile leak, abscess formation, needing a drain placed, the need for additional procedures, reaction to medication, pulmonary aspiration,  failure to diagnose a condition, the possible need to convert to an open procedure, and creating a complication requiring transfusion or operation were discussed with the patient. The patient and/or family concurred with the proposed plan, giving informed consent. Dr. Aleen Campi added emergency surgeries and to avoid any delays I have offered my help. Kathleen Braun  has presented today for surgery, with the diagnosis of symptomatic cholelithiasis.  The various methods of treatment have been discussed with the patient and family. After consideration of risks, benefits and other options for treatment, the patient has consented to  Procedure(s): XI ROBOTIC ASSISTED LAPAROSCOPIC CHOLECYSTECTOMY (N/A) INDOCYANINE GREEN FLUORESCENCE IMAGING (ICG) (N/A) as a surgical intervention.  The patient's history has been reviewed, patient examined, no change in status, stable for surgery.  I have reviewed the patient's chart and labs.  Questions were answered to the patient's satisfaction.     Stephan Nelis F Clemons Salvucci

## 2023-01-16 NOTE — Anesthesia Postprocedure Evaluation (Signed)
Anesthesia Post Note  Patient: Administrator, [employer]  Procedure(s) Performed: XI ROBOTIC ASSISTED LAPAROSCOPIC CHOLECYSTECTOMY INDOCYANINE GREEN FLUORESCENCE IMAGING (ICG)  Patient location during evaluation: PACU Anesthesia Type: General Level of consciousness: awake and alert Pain management: pain level controlled Vital Signs Assessment: post-procedure vital signs reviewed and stable Respiratory status: spontaneous breathing, nonlabored ventilation, respiratory function stable and patient connected to nasal cannula oxygen Cardiovascular status: blood pressure returned to baseline and stable Postop Assessment: no apparent nausea or vomiting Anesthetic complications: no   No notable events documented.   Last Vitals:  Vitals:   01/16/23 1630 01/16/23 1645  BP: (!) 115/49 115/76  Pulse: 80 94  Resp: 19 15  Temp: (!) 36.3 C 36.8 C  SpO2: 97% 100%    Last Pain:  Vitals:   01/16/23 1645  TempSrc: Temporal  PainSc: 0-No pain                 Cleda Mccreedy Salih Williamson

## 2023-01-17 ENCOUNTER — Telehealth: Payer: Self-pay | Admitting: *Deleted

## 2023-01-17 NOTE — Telephone Encounter (Signed)
Patients father called and wanted to see if he can get his daughters pain medication sent into her pharmacy electronically instead of the paper script that they received from the hospital. Patients father stated that he called his pharmacy ([facility] [address]) and they told him they do not accept paper script for narcotics. I spoke with Dr Everlene Farrier and he agrees that any pharmacy in [place] should be able to accept the paper script. I advise patient that he needs to take her prescription to them and not call them for clarification. I advise the patient if the pharmacy has a problem with this then they can call the office to talk with Dr Everlene Farrier.

## 2023-01-29 ENCOUNTER — Ambulatory Visit (INDEPENDENT_AMBULATORY_CARE_PROVIDER_SITE_OTHER): Payer: Federal, State, Local not specified - PPO | Admitting: Surgery

## 2023-01-29 ENCOUNTER — Encounter: Payer: Self-pay | Admitting: Surgery

## 2023-01-29 VITALS — BP 116/79 | HR 74 | Temp 98.8°F | Ht 64.0 in | Wt 204.4 lb

## 2023-01-29 DIAGNOSIS — Z09 Encounter for follow-up examination after completed treatment for conditions other than malignant neoplasm: Secondary | ICD-10-CM

## 2023-01-29 DIAGNOSIS — K8010 Calculus of gallbladder with chronic cholecystitis without obstruction: Secondary | ICD-10-CM

## 2023-01-29 NOTE — Patient Instructions (Signed)

## 2023-01-31 NOTE — Progress Notes (Signed)
Kathleen Braun is 2 weeks out from robotic cholecystectomy.  No evidence of complications doing very well taking p.o. no fevers no chills.  Ambulating.  PE NAD  Abd: soft, Nt, incisions healing well without infection.  A/P doing very well without complications. Excuse given.  RTC as needed

## 2023-11-11 IMAGING — DX DG FOREARM 2V*R*
2 series · 2 of 2 positions shown · non-contrast
Comparison: None.

CLINICAL DATA: Fall, soccer injury, pain

EXAM:
RIGHT FOREARM - 2 VIEW

[forearm ap]
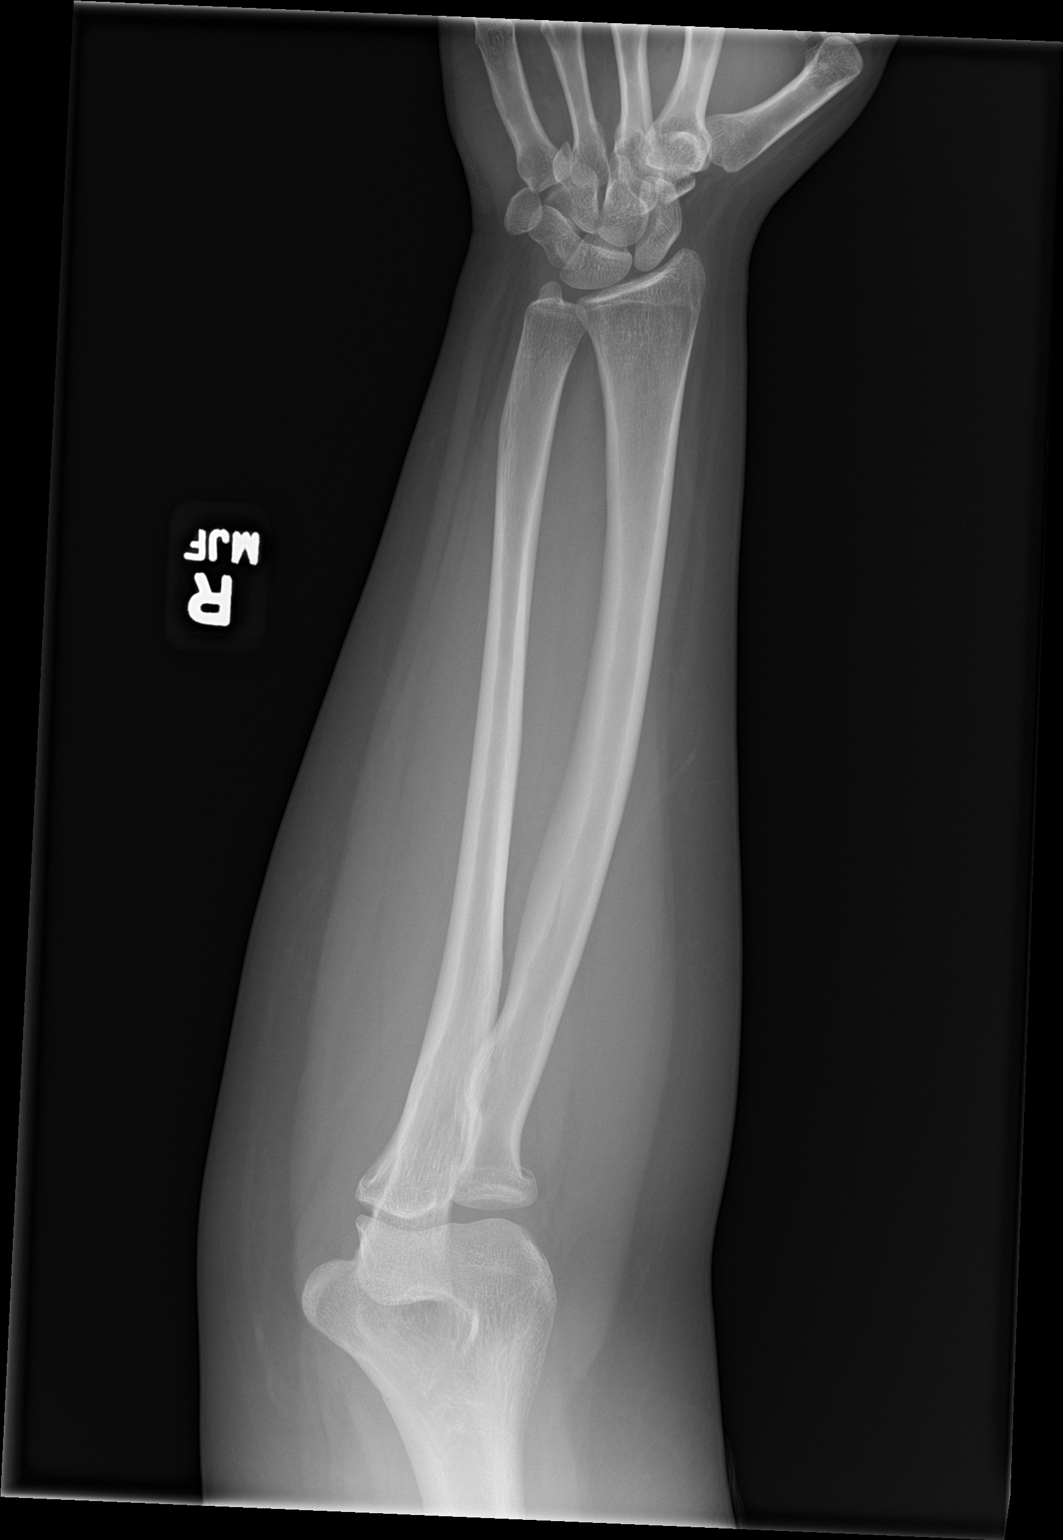

[forearm lat]
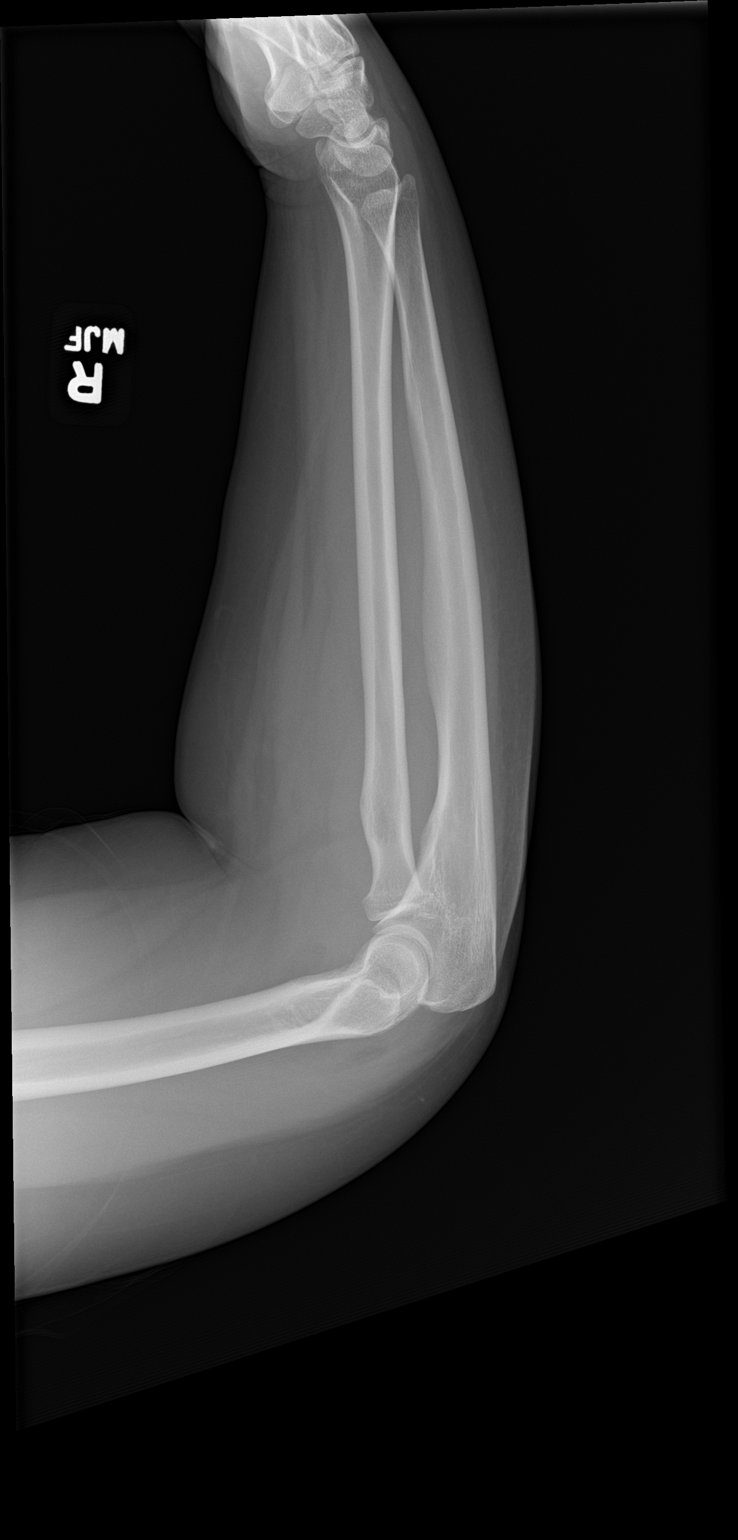

[2 of 2 positions shown; findings below may reference images not displayed]

FINDINGS: No fracture or dislocation is seen.

The joint spaces are preserved.

Visualized soft tissues are within normal limits.
IMPRESSION: Negative.

## 2023-11-11 IMAGING — DX DG ELBOW COMPLETE 3+V*R*
4 series · 4 of 4 positions shown · non-contrast
Comparison: None.

CLINICAL DATA: Fall, soccer injury, pain

EXAM:
RIGHT ELBOW - COMPLETE 3+ VIEW

[elbow ap]
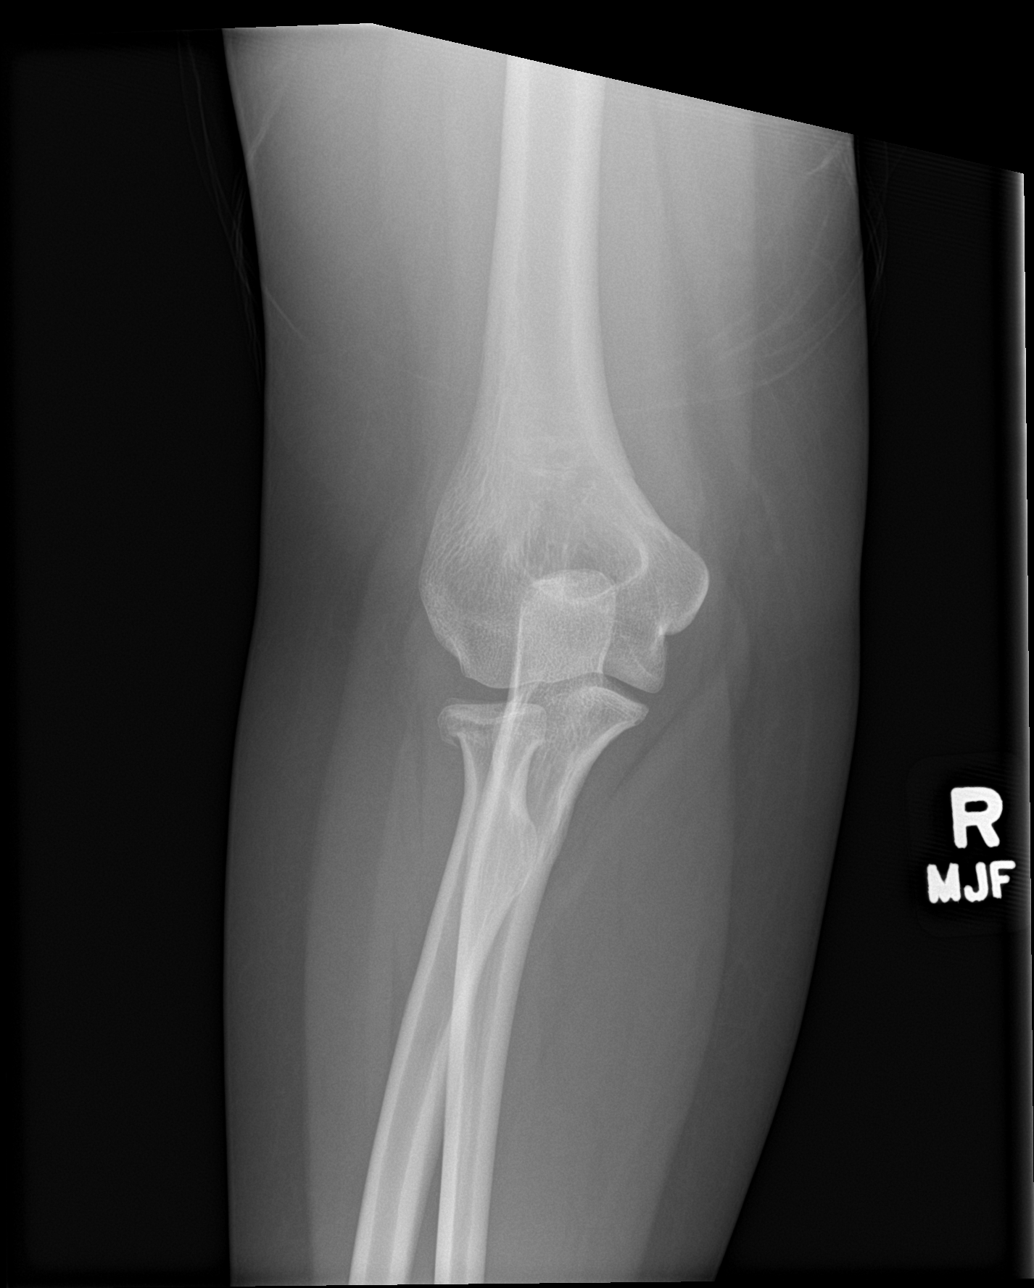

[elbow obl (1 of 2)]
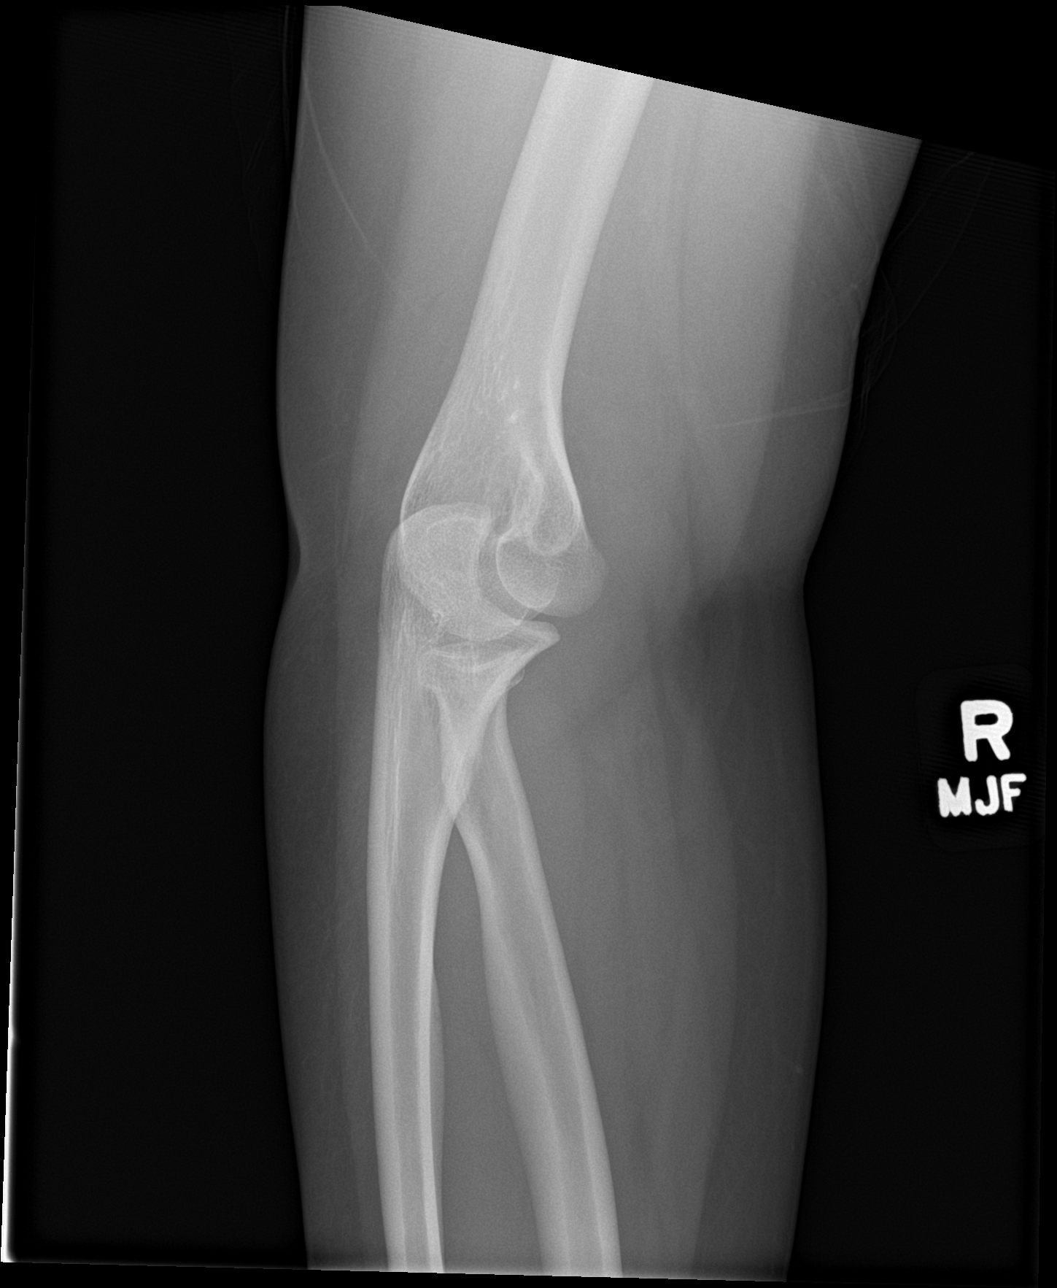

[elbow obl (2 of 2)]
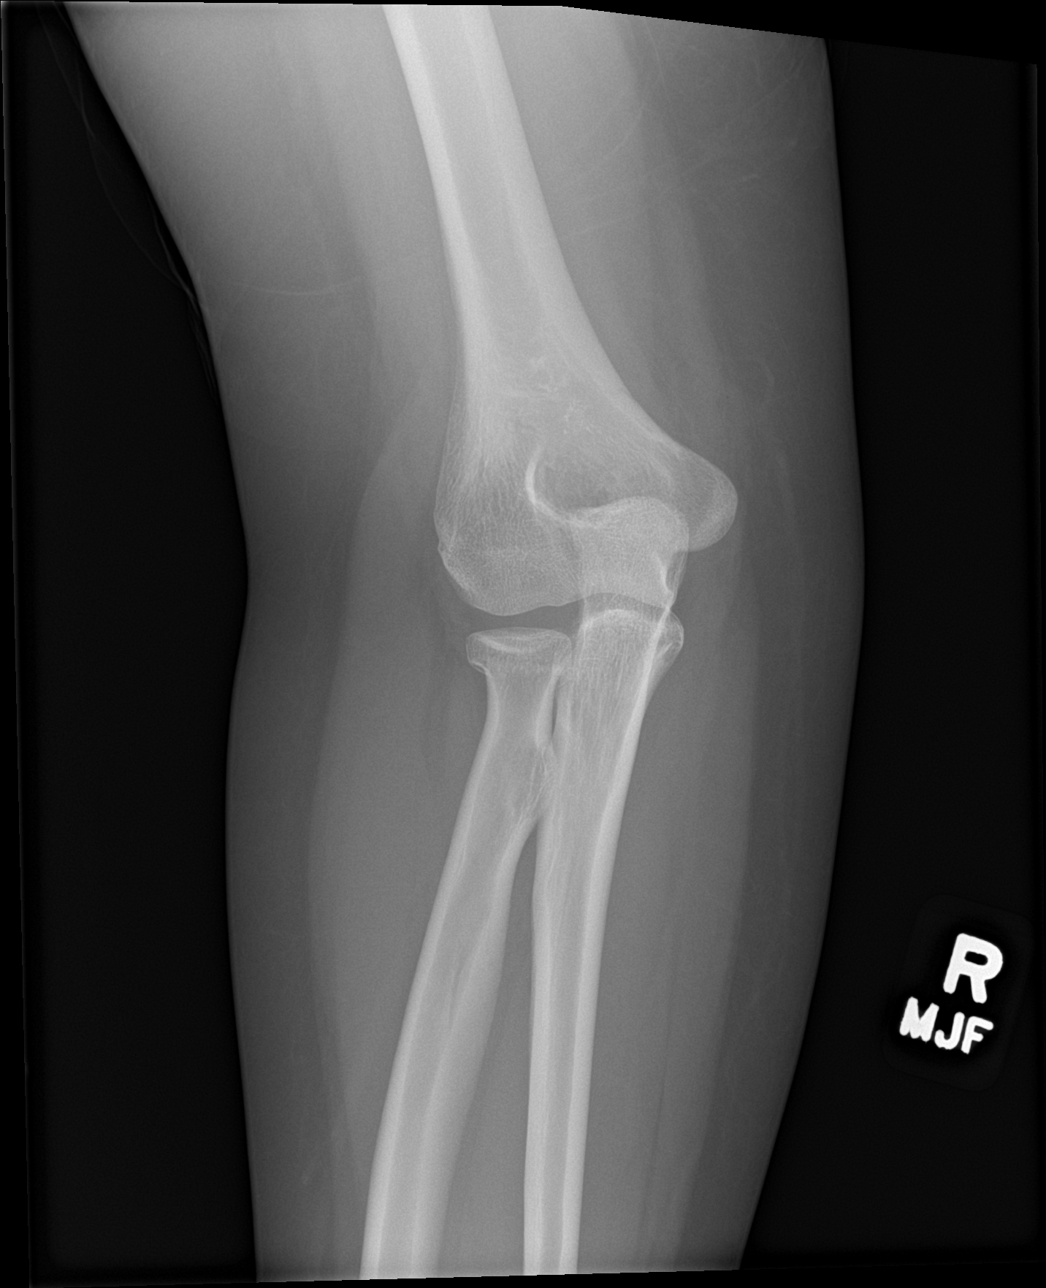

[elbow lat]
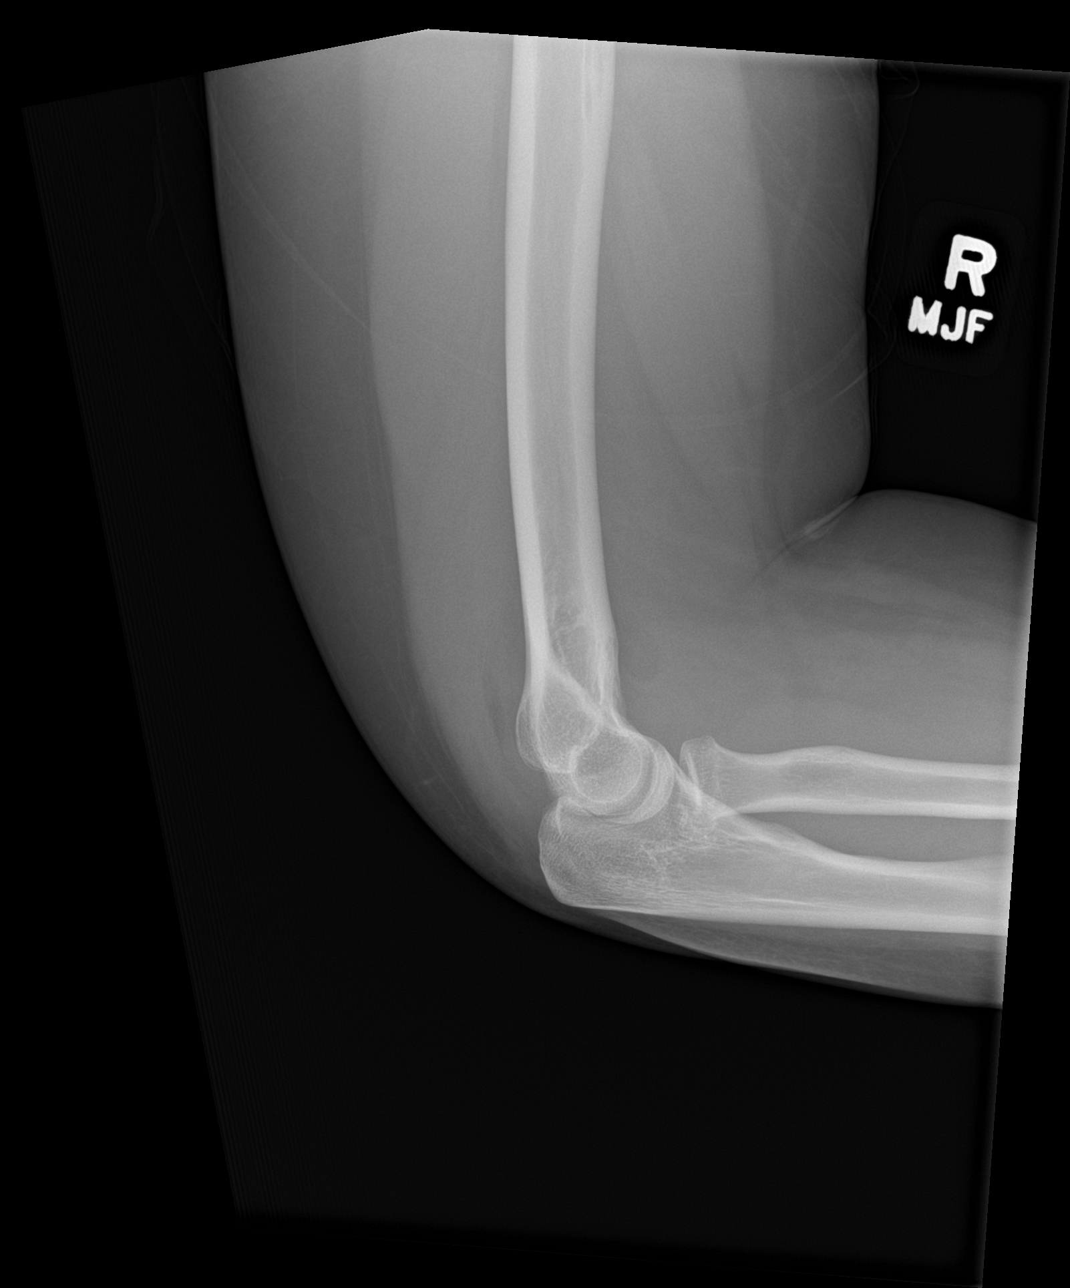

[4 of 4 positions shown; findings below may reference images not displayed]

FINDINGS: Cortical irregularity along the lateral aspect of the radial head,
suggesting a nondisplaced radial head fracture.

The joint spaces are preserved.

Displaced elbow joint fat pads, reflecting an elbow joint effusion.
IMPRESSION: Suspected nondisplaced radial head fracture with associated elbow
joint effusion.

## 2023-12-09 ENCOUNTER — Other Ambulatory Visit: Payer: Self-pay

## 2023-12-09 ENCOUNTER — Emergency Department
Admission: EM | Admit: 2023-12-09 | Discharge: 2023-12-09 | Disposition: A | Discharge status: Home / Self Care | Attending: Emergency Medicine | Admitting: Emergency Medicine

## 2023-12-09 ENCOUNTER — Encounter: Payer: Self-pay | Admitting: *Deleted

## 2023-12-09 ENCOUNTER — Emergency Department

## 2023-12-09 DIAGNOSIS — N132 Hydronephrosis with renal and ureteral calculous obstruction: Secondary | ICD-10-CM | POA: Insufficient documentation

## 2023-12-09 DIAGNOSIS — N23 Unspecified renal colic: Secondary | ICD-10-CM

## 2023-12-09 DIAGNOSIS — D72829 Elevated white blood cell count, unspecified: Secondary | ICD-10-CM | POA: Diagnosis not present

## 2023-12-09 DIAGNOSIS — R109 Unspecified abdominal pain: Secondary | ICD-10-CM | POA: Diagnosis present

## 2023-12-09 DIAGNOSIS — N200 Calculus of kidney: Secondary | ICD-10-CM

## 2023-12-09 LAB — URINALYSIS, ROUTINE W REFLEX MICROSCOPIC
Bilirubin Urine: NEGATIVE
Glucose, UA: NEGATIVE mg/dL
Hgb urine dipstick: NEGATIVE
Ketones, ur: NEGATIVE mg/dL
Leukocytes,Ua: NEGATIVE
Nitrite: NEGATIVE
Protein, ur: 30 mg/dL — AB
Specific Gravity, Urine: 1.028 (ref 1.005–1.030)
pH: 8 (ref 5.0–8.0)

## 2023-12-09 LAB — COMPREHENSIVE METABOLIC PANEL WITH GFR
ALT: 21 U/L (ref 0–44)
AST: 20 U/L (ref 15–41)
Albumin: 4 g/dL (ref 3.5–5.0)
Alkaline Phosphatase: 56 U/L (ref 38–126)
Anion gap: 12 (ref 5–15)
BUN: 16 mg/dL (ref 6–20)
CO2: 24 mmol/L (ref 22–32)
Calcium: 9.2 mg/dL (ref 8.9–10.3)
Chloride: 104 mmol/L (ref 98–111)
Creatinine, Ser: 0.77 mg/dL (ref 0.44–1.00)
GFR, Estimated: >60 mL/min (ref >60)
Glucose, Bld: 102 mg/dL — ABNORMAL HIGH (ref 70–99)
Potassium: 3.7 mmol/L (ref 3.5–5.1)
Sodium: 140 mmol/L (ref 135–145)
Total Bilirubin: 0.5 mg/dL (ref 0.0–1.2)
Total Protein: 7.4 g/dL (ref 6.5–8.1)

## 2023-12-09 LAB — CBC
HCT: 37.5 % (ref 36.0–46.0)
Hemoglobin: 12.8 g/dL (ref 12.0–15.0)
MCH: 28.6 pg (ref 26.0–34.0)
MCHC: 34.1 g/dL (ref 30.0–36.0)
MCV: 83.9 fL (ref 80.0–100.0)
Platelets: 288 10*3/uL (ref 150–400)
RBC: 4.47 MIL/uL (ref 3.87–5.11)
RDW: 12.5 % (ref 11.5–15.5)
WBC: 11.1 10*3/uL — ABNORMAL HIGH (ref 4.0–10.5)
nRBC: 0 % (ref 0.0–0.2)

## 2023-12-09 LAB — POC URINE PREG, ED: Preg Test, Ur: NEGATIVE

## 2023-12-09 MED ORDER — DOCUSATE SODIUM 100 MG PO CAPS: ORAL_CAPSULE | ORAL | 0 refills | Status: AC

## 2023-12-09 MED ORDER — ONDANSETRON 4 MG PO TBDP: ORAL_TABLET | ORAL | 0 refills | Status: AC

## 2023-12-09 MED ORDER — LACTATED RINGERS IV BOLUS: 1000.0000 mL | Freq: Once | INTRAVENOUS | Status: DC

## 2023-12-09 MED ORDER — ONDANSETRON HCL 4 MG/2ML IJ SOLN
4.0000 mg | INTRAMUSCULAR | Status: AC
  Administered 2023-12-09: 4 mg via INTRAVENOUS
  Filled 2023-12-09: qty 2

## 2023-12-09 MED ORDER — TAMSULOSIN HCL 0.4 MG PO CAPS: ORAL_CAPSULE | ORAL | 0 refills | Status: AC

## 2023-12-09 MED ORDER — IOHEXOL 350 MG/ML SOLN
100.0000 mL | Freq: Once | INTRAVENOUS | Status: AC | PRN
  Administered 2023-12-09: 100 mL via INTRAVENOUS

## 2023-12-09 MED ORDER — SODIUM CHLORIDE 0.9 % IV BOLUS
1000.0000 mL | Freq: Once | INTRAVENOUS | Status: AC
  Administered 2023-12-09: 1000 mL via INTRAVENOUS

## 2023-12-09 MED ORDER — KETOROLAC TROMETHAMINE 30 MG/ML IJ SOLN
15.0000 mg | Freq: Once | INTRAMUSCULAR | Status: AC
  Administered 2023-12-09: 15 mg via INTRAVENOUS
  Filled 2023-12-09: qty 1

## 2023-12-09 MED ORDER — OXYCODONE-ACETAMINOPHEN 5-325 MG PO TABS: 2.0000 | ORAL_TABLET | Freq: Four times a day (QID) | ORAL | 0 refills | Status: AC | PRN

## 2023-12-09 MED ORDER — MORPHINE SULFATE (PF) 4 MG/ML IV SOLN
4.0000 mg | Freq: Once | INTRAVENOUS | Status: AC
  Administered 2023-12-09: 4 mg via INTRAVENOUS
  Filled 2023-12-09: qty 1

## 2023-12-09 NOTE — Discharge Instructions (Signed)
You have been seen in the Emergency Department (ED) today for pain caused by kidney stones.  As we have discussed, please drink plenty of fluids.  Please make a follow up appointment with the physician(s) listed elsewhere in this documentation.  You may take pain medication as needed but ONLY as prescribed.  Please also take your prescribed Flomax daily.  If you are going to follow up with Urology for possible lithotripsy, please do not take any ibuprofen, naproxen, aspirin, Toradol, or other NSAID after Tuesday morning, as this may exclude you from lithotripsy.  Please see your doctor as soon as possible as stones may take 1-3 weeks to pass and you may require additional care or medications.  Do not drink alcohol, drive or participate in any other potentially dangerous activities while taking opiate pain medication as it may make you sleepy. Do not take this medication with any other sedating medications, either prescription or over-the-counter. If you were prescribed Percocet or Vicodin, do not take these with acetaminophen (Tylenol) as it is already contained within these medications.   Take Percocet as needed for severe pain.  This medication is an opiate (or narcotic) pain medication and can be habit forming.  Use it as little as possible to achieve adequate pain control.  Do not use or use it with extreme caution if you have a history of opiate abuse or dependence.  If you are on a pain contract with your primary care doctor or a pain specialist, be sure to let them know you were prescribed this medication today from the [facility].  This medication is intended for your use only - do not give any to anyone else and keep it in a secure place where nobody else, especially children, have access to it.  It will also cause or worsen constipation, so you may want to consider taking an over-the-counter stool softener while you are taking this medication.  Return to the Emergency  Department (ED) or call your doctor if you have any worsening pain, fever, painful urination, are unable to urinate, or develop other symptoms that concern you.  

## 2023-12-09 NOTE — ED Provider Notes (Signed)
 [facility] Provider Note    Event Date/Time   First MD Initiated Contact with Patient 12/09/23 [phone]     (approximate)   History   Flank Pain   HPI Kathleen Braun is a 18 y.o. female whose only significant past medical history includes prior cholelithiasis status postcholecystectomy as well as obesity.  She presents for evaluation of acute onset severe pain in her right side that radiates around to the right flank.  It started a few hours ago and is steadily gotten worse.  It is accompanied with persistent nausea and multiple episodes of vomiting, which has continued to the emergency department.  She felt fine earlier in the day.  She has not had increased urinary frequency or pain when she urinates.  No prior history of kidney stones.  No genital complaints.     Physical Exam   Triage Vital Signs: ED Triage Vitals [12/09/23 0141]  Encounter Vitals Group     BP 136/84     Systolic BP Percentile      Diastolic BP Percentile      Pulse Rate 72     Resp 18     Temp 97.9 F (36.6 C)     Temp Source Oral     SpO2 98 %     Weight 95.3 kg (210 lb)     Height 1.626 m (5\' 4" )     Head Circumference      Peak Flow      Pain Score 8     Pain Loc      Pain Education      Exclude from Growth Chart     Most recent vital signs: Vitals:   12/09/23 0345 12/09/23 0515  BP: 127/72 (!) 138/54  Pulse: 71 76  Resp:    Temp:    SpO2: 95% 99%    General: Awake, alert, in acute distress, vomiting and complaining of severe pain. CV:  Good peripheral perfusion.  Regular rate and rhythm. Resp:  Normal effort. Speaking easily and comfortably, no accessory muscle usage nor intercostal retractions.   Abd:  No distention.  Mild tenderness to palpation of the right side of the abdomen and percussion of the right flank. Other:  Patient actively vomiting and is in substantial distress consistent with renal/ureteral colic.   ED Results / Procedures / Treatments    Labs (all labs ordered are listed, but only abnormal results are displayed) Labs Reviewed  COMPREHENSIVE METABOLIC PANEL WITH GFR - Abnormal; Notable for the following components:      Result Value   Glucose, Bld 102 (*)    All other components within normal limits  CBC - Abnormal; Notable for the following components:   WBC 11.1 (*)    All other components within normal limits  URINALYSIS, ROUTINE W REFLEX MICROSCOPIC - Abnormal; Notable for the following components:   Color, Urine YELLOW (*)    APPearance HAZY (*)    Protein, ur 30 (*)    Bacteria, UA RARE (*)    All other components within normal limits  POC URINE PREG, ED      RADIOLOGY See ED course for detailed   PROCEDURES:  Critical Care performed: No  Procedures    IMPRESSION / MDM / ASSESSMENT AND PLAN / ED COURSE  I reviewed the triage vital signs and the nursing notes.  Differential diagnosis includes, but is not limited to, renal/ureteral colic, UTI/pyelonephritis, appendicitis, SBO/ileus, STD/PID/TOA, ovarian cyst/torsion.  Patient's presentation is most consistent with acute presentation with potential threat to life or bodily function.  Labs/studies ordered: Urinalysis, urine pregnancy, CBC, CMP, CT abdomen/pelvis  Interventions/Medications given:  Medications  morphine (PF) 4 MG/ML injection 4 mg (4 mg Intravenous Given 12/09/23 0328)  ondansetron  (ZOFRAN ) injection 4 mg (4 mg Intravenous Given 12/09/23 0328)  ketorolac  (TORADOL ) 30 MG/ML injection 15 mg (15 mg Intravenous Given 12/09/23 0328)  iohexol  (OMNIPAQUE ) 350 MG/ML injection 100 mL (100 mLs Intravenous Contrast Given 12/09/23 0359)  sodium chloride  0.9 % bolus 1,000 mL (1,000 mLs Intravenous New Bag/Given 12/09/23 0415)    (Note:  hospital course my include additional interventions and/or labs/studies not listed above.)   Patient's presentation is by far most consistent with ureteral colic.  I ordered the  medications listed above including IV morphine and IV Toradol  and IV Zofran .  Also ordered normal saline 1 L IV bolus because the patient's mother is concerned about, she has been vomiting and it may also help flush out what I suspect this is stone.  Urinalysis is pending.  Once labs are back and reassuring I will proceed with CT of the abdomen and pelvis to rule out appendicitis (the patient's mother's main concern) and the renal/ureteral colic.     Clinical Course as of 12/09/23 4098  Tue Dec 09, 2023  0352 Urinalysis, Routine w reflex microscopic -Urine, Clean Catch(!) No clear infection or hematuria [CF]  0412 CT ABDOMEN PELVIS W CONTRAST Lab work reassuring other than a very minimal leukocytosis.  I independently viewed and interpreted the patient's CT of the abdomen and pelvis and I can see some evidence of right-sided hydronephrosis.  I could not identify a stone but the radiologist identified a small distal ureteral stone which which correlates clinically with the patient's symptoms.  I will reassess shortly. [CF]  0608 I reassessed the patient and she feels much better, pain has completely resolved.  She feels "out of it" from the morphine but otherwise is doing well.  I updated her and her mother about the findings of the ureteral stone and had my usual and customary kidney stone discussion with them.  They are comfortable with the plan for discharge and outpatient follow-up.  No indication for antibiotics at this time.  I gave my usual and customary return precautions. [CF]    Clinical Course User Index [CF] Lynnda Sas, MD     FINAL CLINICAL IMPRESSION(S) / ED DIAGNOSES   Final diagnoses:  Ureteral colic  Kidney stone     Rx / DC Orders   ED Discharge Orders          Ordered    oxyCODONE -acetaminophen  (PERCOCET) 5-325 MG tablet  Every 6 hours PRN        12/09/23 0614    ondansetron  (ZOFRAN -ODT) 4 MG disintegrating tablet        12/09/23 0614    tamsulosin (FLOMAX)  0.4 MG CAPS capsule        12/09/23 0614    docusate sodium (COLACE) 100 MG capsule        12/09/23 1191             Note:  This document was prepared using Dragon voice recognition software and may include unintentional dictation errors.   Lynnda Sas, MD 12/09/23 [phone]

## 2023-12-09 NOTE — ED Notes (Signed)
Urine and blood work sent to lab at this time.

## 2023-12-09 NOTE — ED Triage Notes (Signed)
 Pt ambulatory to triage   pt has right flank pain with nausea .  No hx kidney stones.   Sx began tonight.  Denies dysuria.  Pt alert
